# Patient Record
Sex: Female | Born: 1975 | Race: Black or African American | Hispanic: No
Health system: Southern US, Community
[De-identification: ages and names within clinical notes are randomized; demographics above are authoritative.]

---

## 2021-04-14 ENCOUNTER — Emergency Department (HOSPITAL_COMMUNITY)
Admission: EM | Admit: 2021-04-14 | Discharge: 2021-04-14 | Disposition: A | Discharge status: Home / Self Care | Payer: Medicare HMO | Attending: Emergency Medicine | Admitting: Emergency Medicine

## 2021-04-14 ENCOUNTER — Other Ambulatory Visit: Payer: Self-pay

## 2021-04-14 ENCOUNTER — Emergency Department (HOSPITAL_COMMUNITY): Payer: Medicare HMO

## 2021-04-14 DIAGNOSIS — Z91040 Latex allergy status: Secondary | ICD-10-CM | POA: Insufficient documentation

## 2021-04-14 DIAGNOSIS — M25561 Pain in right knee: Secondary | ICD-10-CM | POA: Diagnosis present

## 2021-04-14 DIAGNOSIS — M25562 Pain in left knee: Secondary | ICD-10-CM | POA: Insufficient documentation

## 2021-04-14 DIAGNOSIS — R600 Localized edema: Secondary | ICD-10-CM | POA: Insufficient documentation

## 2021-04-14 DIAGNOSIS — G8929 Other chronic pain: Secondary | ICD-10-CM | POA: Diagnosis not present

## 2021-04-14 LAB — CBC WITH DIFFERENTIAL/PLATELET
Abs Immature Granulocytes: 0.02 10*3/uL (ref 0.00–0.07)
Basophils Absolute: 0 10*3/uL (ref 0.0–0.1)
Basophils Relative: 1 %
Eosinophils Absolute: 0.3 10*3/uL (ref 0.0–0.5)
Eosinophils Relative: 5 %
HCT: 35.5 % — ABNORMAL LOW (ref 36.0–46.0)
Hemoglobin: 11.7 g/dL — ABNORMAL LOW (ref 12.0–15.0)
Immature Granulocytes: 0 %
Lymphocytes Relative: 36 %
Lymphs Abs: 2.4 10*3/uL (ref 0.7–4.0)
MCH: 31.1 pg (ref 26.0–34.0)
MCHC: 33 g/dL (ref 30.0–36.0)
MCV: 94.4 fL (ref 80.0–100.0)
Monocytes Absolute: 0.7 10*3/uL (ref 0.1–1.0)
Monocytes Relative: 10 %
Neutro Abs: 3.3 10*3/uL (ref 1.7–7.7)
Neutrophils Relative %: 48 %
Platelets: 361 10*3/uL (ref 150–400)
RBC: 3.76 MIL/uL — ABNORMAL LOW (ref 3.87–5.11)
RDW: 15.7 % — ABNORMAL HIGH (ref 11.5–15.5)
WBC: 6.8 10*3/uL (ref 4.0–10.5)
nRBC: 0 % (ref 0.0–0.2)

## 2021-04-14 LAB — COMPREHENSIVE METABOLIC PANEL
ALT: 21 U/L (ref 0–44)
AST: 26 U/L (ref 15–41)
Albumin: 3.3 g/dL — ABNORMAL LOW (ref 3.5–5.0)
Alkaline Phosphatase: 76 U/L (ref 38–126)
Anion gap: 4 — ABNORMAL LOW (ref 5–15)
BUN: 5 mg/dL — ABNORMAL LOW (ref 6–20)
CO2: 30 mmol/L (ref 22–32)
Calcium: 9.2 mg/dL (ref 8.9–10.3)
Chloride: 104 mmol/L (ref 98–111)
Creatinine, Ser: 0.77 mg/dL (ref 0.44–1.00)
GFR, Estimated: >60 mL/min (ref >60)
Glucose, Bld: 95 mg/dL (ref 70–99)
Potassium: 3.4 mmol/L — ABNORMAL LOW (ref 3.5–5.1)
Sodium: 138 mmol/L (ref 135–145)
Total Bilirubin: 0.6 mg/dL (ref 0.3–1.2)
Total Protein: 7.4 g/dL (ref 6.5–8.1)

## 2021-04-14 LAB — BRAIN NATRIURETIC PEPTIDE: B Natriuretic Peptide: 29.1 pg/mL (ref 0.0–100.0)

## 2021-04-14 MED ORDER — OXYCODONE-ACETAMINOPHEN 5-325 MG PO TABS
1.0000 | ORAL_TABLET | Freq: Once | ORAL | Status: AC
  Administered 2021-04-14: 1 via ORAL
  Filled 2021-04-14: qty 1

## 2021-04-14 MED ORDER — OXYCODONE-ACETAMINOPHEN 5-325 MG PO TABS: 1.0000 | ORAL_TABLET | Freq: Four times a day (QID) | ORAL | 0 refills | Status: AC | PRN

## 2021-04-14 NOTE — Discharge Instructions (Signed)
Call your primary care doctor or specialist as discussed in the next 2-3 days.   Return immediately back to the ER if:  Your symptoms worsen within the next 12-24 hours. You develop new symptoms such as new fevers, persistent vomiting, new pain, shortness of breath, or new weakness or numbness, or if you have any other concerns.  

## 2021-04-14 NOTE — ED Triage Notes (Signed)
Pt here via pov with reports of bil knee pain and edema for several weeks. Pt reports using voltaren, ibuprofen and flexeril to alleviate the pain without relief. Denies known injury.

## 2021-04-14 NOTE — ED Triage Notes (Signed)
Emergency Medicine Provider Triage Evaluation Note  Deborah Burch , a 45 y.o. female  was evaluated in triage.  Pt complains of bilateral knee pain for the past several weeks. Reports history of torn meniscus in right knee April last year with fixation - no issues until about a couple of weeks ago. Also complaining of bilateral leg swelling; had her spironolactone and lasix increased over the past couple of weeks in [facility] where she resides with some improvement. Was having pain and swelling prior to traveling by plane to [place] to visit her sister. No chest pain or SOB.  Review of Systems  Positive: + bilateral knee pain, + bilateral leg swelling Negative: - chest pain, - SOB  Physical Exam  BP 117/82 (BP Location: Left Arm)   Pulse (!) 105   Temp 98.7 F (37.1 C) (Oral)   Resp 16   SpO2 97%  Gen:   Awake, no distress   HEENT:  Atraumatic  Resp:  Normal effort  Cardiac:  Normal rate  Abd:   Nondistended, nontender  MSK:   Moves extremities without difficulty. 1+ pitting edema to BLEs. + TTP throughout calves and knees.  Neuro:  Speech clear   Medical Decision Making  Medically screening exam initiated at 11:22 AM.  Appropriate orders placed.  Deborah Burch was informed that the remainder of the evaluation will be completed by another provider, this initial triage assessment does not replace that evaluation, and the importance of remaining in the ED until their evaluation is complete.  Clinical Impression  45 year old female with atraumatic bilateral knee pain and leg swelling for the past several weeks; no trauma. On fluids pills however denies hx CHF. Currently visiting sister from [place]. Will plan for xrays and labwork including BNP to assess for concern for fluid overload. Given BLE swelling lower suspicion for DVT.    Deborah Rockers, PA-C 04/14/21 1125

## 2021-04-14 NOTE — ED Provider Notes (Signed)
[facility] EMERGENCY DEPARTMENT Provider Note   CSN: [id number] Arrival date & time: 04/14/21  1058     History No chief complaint on file.   Deborah Burch is a 45 y.o. female.  Patient presents with bilateral knee pain.  Ongoing for several weeks.  She states the right is worse than the left.  Denies any new fall or trauma.  She recently has been visiting the area from [place].  She has an orthopedist specialist in [place] with whom she has been following for her knee pain.  She also has a history of arthritis sees a rheumatologist.  She also has a history of intermittent bilateral lower extremity swelling has been taking spironolactone for some time now.  Otherwise denies fevers or cough no vomiting or diarrhea no headache no chest pain or shortness of breath.        No past medical history on file.  There are no problems to display for this patient.      OB History   No obstetric history on file.     No family history on file.     Home Medications Prior to Admission medications   Medication Sig Start Date End Date Taking? Authorizing Provider  oxyCODONE-acetaminophen (PERCOCET/ROXICET) 5-325 MG tablet Take 1 tablet by mouth every 6 (six) hours as needed for severe pain. 04/14/21  Yes Cheryll Cockayne, MD    Allergies    Latex and Sulfa antibiotics  Review of Systems   Review of Systems  Constitutional: Negative for fever.  HENT: Negative for ear pain.   Eyes: Negative for pain.  Respiratory: Negative for cough.   Cardiovascular: Negative for chest pain.  Gastrointestinal: Negative for abdominal pain.  Genitourinary: Negative for flank pain.  Musculoskeletal: Negative for back pain.  Skin: Negative for rash.  Neurological: Negative for headaches.    Physical Exam Updated Vital Signs BP 121/86   Pulse 98   Temp 98.7 F (37.1 C) (Oral)   Resp 16   SpO2 98%   Physical Exam Constitutional:      General: She is not in acute  distress.    Appearance: Normal appearance.  HENT:     Head: Normocephalic.     Nose: Nose normal.  Eyes:     Extraocular Movements: Extraocular movements intact.  Cardiovascular:     Rate and Rhythm: Normal rate.  Pulmonary:     Effort: Pulmonary effort is normal.  Musculoskeletal:        General: Normal range of motion.     Cervical back: Normal range of motion.     Comments: Bilateral diffuse knee tenderness noted.  On visual inspection no gross abnormality or or swelling noted.  No abnormal warmth noted on palpation.  Patient has normal range of motion but describes it as painful.  Neurovascular intact otherwise.  Bilateral lower extremity has 1+ pitting edema bilaterally.  No abnormal warmth or cellulitis noted.    Neurological:     General: No focal deficit present.     Mental Status: She is alert. Mental status is at baseline.     ED Results / Procedures / Treatments   Labs (all labs ordered are listed, but only abnormal results are displayed) Labs Reviewed  COMPREHENSIVE METABOLIC PANEL - Abnormal; Notable for the following components:      Result Value   Potassium 3.4 (*)    BUN 5 (*)    Albumin 3.3 (*)    Anion gap 4 (*)  All other components within normal limits  CBC WITH DIFFERENTIAL/PLATELET - Abnormal; Notable for the following components:   RBC 3.76 (*)    Hemoglobin 11.7 (*)    HCT 35.5 (*)    RDW 15.7 (*)    All other components within normal limits  BRAIN NATRIURETIC PEPTIDE    EKG None  Radiology DG Chest 2 View  Result Date: 04/14/2021 CLINICAL DATA:  Bilateral knee pain and edema for several weeks. EXAM: CHEST - 2 VIEW COMPARISON:  None. FINDINGS: The heart size and mediastinal contours are within normal limits. Both lungs are clear. No pleural effusion or pneumothorax. The visualized skeletal structures are intact. IMPRESSION: No active cardiopulmonary disease. Electronically Signed   By: Amie Portland M.D.   On: 04/14/2021 12:21   DG  Knee Complete 4 Views Left  Result Date: 04/14/2021 CLINICAL DATA:  Bilateral knee pain and edema for several weeks. No known injury. EXAM: LEFT KNEE - COMPLETE 4+ VIEW COMPARISON:  None. FINDINGS: No evidence of fracture, dislocation, or joint effusion. No evidence of arthropathy or other focal bone abnormality. Soft tissues are unremarkable. IMPRESSION: Negative. Electronically Signed   By: Amie Portland M.D.   On: 04/14/2021 12:20   DG Knee Complete 4 Views Right  Result Date: 04/14/2021 CLINICAL DATA:  Bilateral knee pain and edema for several weeks. No known injury. EXAM: RIGHT KNEE - COMPLETE 4+ VIEW COMPARISON:  None. FINDINGS: No evidence of fracture, dislocation, or joint effusion. No evidence of arthropathy or other focal bone abnormality. Soft tissues are unremarkable. IMPRESSION: Negative. Electronically Signed   By: Amie Portland M.D.   On: 04/14/2021 12:20    Procedures Procedures   Medications Ordered in ED Medications  oxyCODONE-acetaminophen (PERCOCET/ROXICET) 5-325 MG per tablet 1 tablet (1 tablet Oral Given 04/14/21 1321)    ED Course  I have reviewed the triage vital signs and the nursing notes.  Pertinent labs & imaging results that were available during my care of the patient were reviewed by me and considered in my medical decision making (see chart for details).    MDM Rules/Calculators/A&P                          Labs are sent white count of 6 hemoglobin is 11 chemistry unremarkable chest x-ray shows no acute cardiopulmonary disease noted.  Given history and evaluation, I doubt DVT, I doubt cellulitis, I doubt septic joint.    Plan remainder of labs and work-up is otherwise unremarkable imaging is unremarkable.  Patient given prescription of pain medication to take at home, advised follow-up with specialist within the week.  Advised return if she has fevers worsening pain or any additional concerns.  Final Clinical Impression(s) / ED Diagnoses Final  diagnoses:  Chronic pain of both knees    Rx / DC Orders ED Discharge Orders         Ordered    oxyCODONE-acetaminophen (PERCOCET/ROXICET) 5-325 MG tablet  Every 6 hours PRN        04/14/21 1321           Cheryll Cockayne, MD 04/14/21 1321

## 2022-03-26 IMAGING — DX DG CHEST 2V
2 series · 2 of 2 positions shown · non-contrast
Comparison: None.

CLINICAL DATA: Bilateral knee pain and edema for several weeks.

EXAM:
CHEST - 2 VIEW

[chest pa]
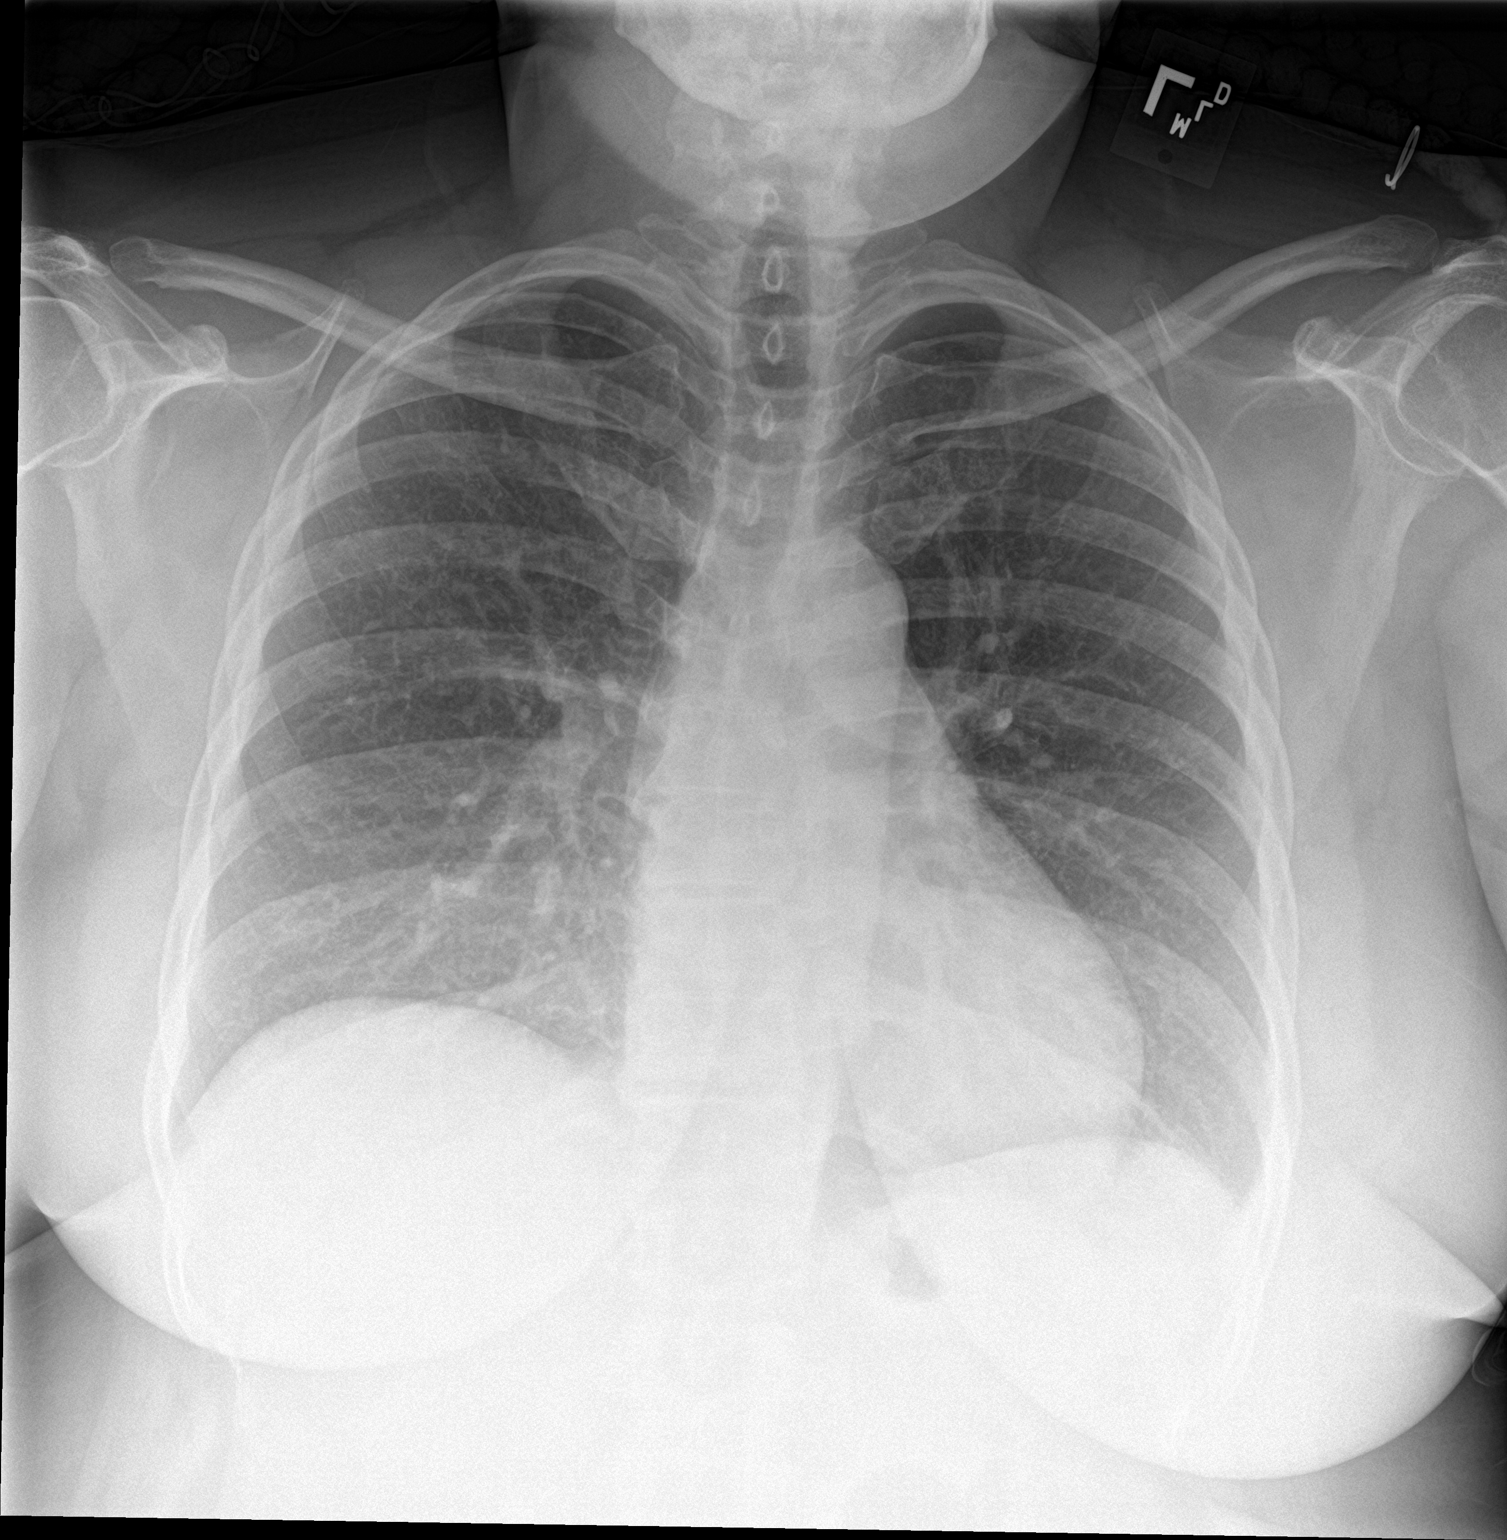

[chest lat]
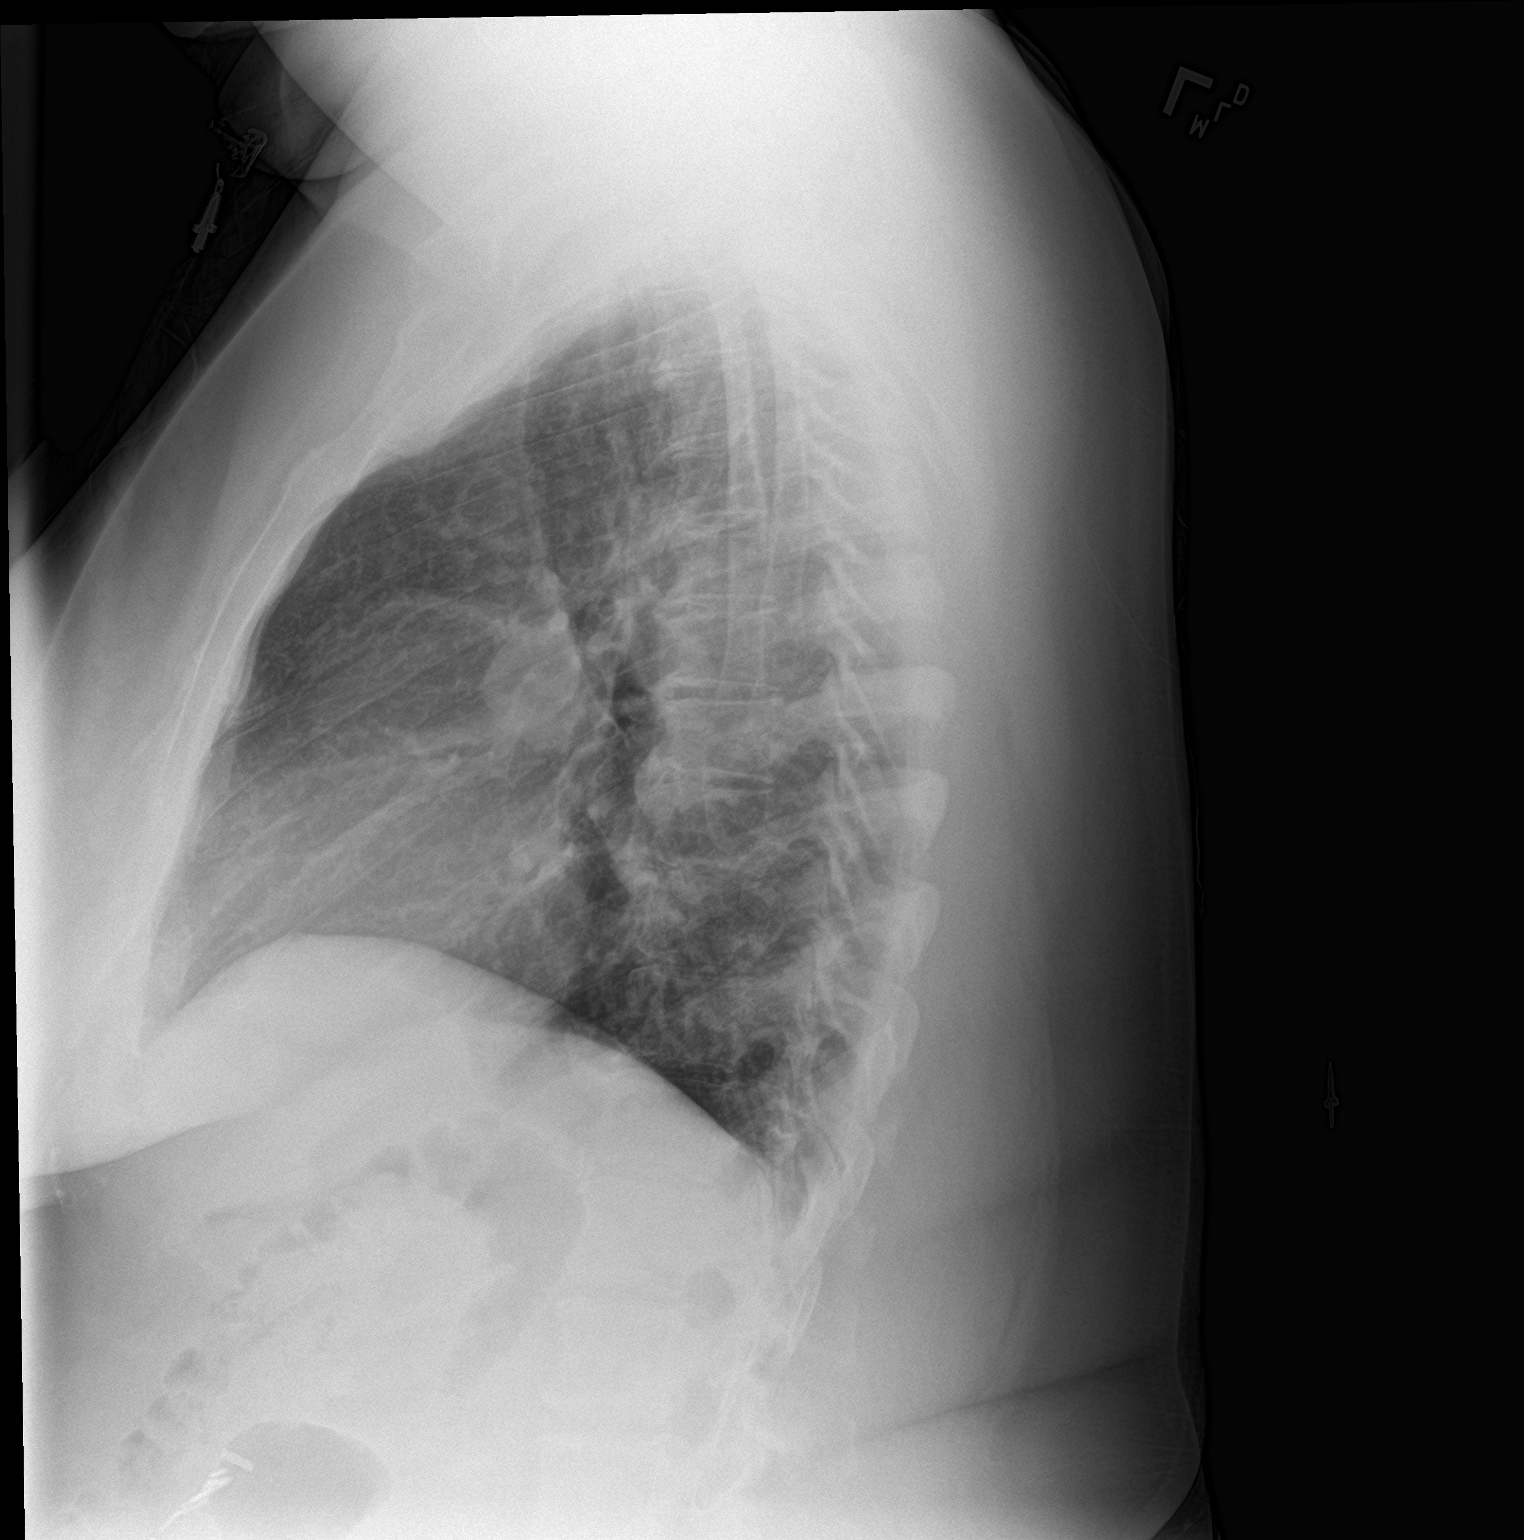

[2 of 2 positions shown; findings below may reference images not displayed]

FINDINGS: The heart size and mediastinal contours are within normal limits.
Both lungs are clear. No pleural effusion or pneumothorax. The
visualized skeletal structures are intact.
IMPRESSION: No active cardiopulmonary disease.

## 2022-03-26 IMAGING — DX DG KNEE COMPLETE 4+V*R*
4 series · 4 of 4 positions shown · non-contrast
Comparison: None.

CLINICAL DATA: Bilateral knee pain and edema for several weeks. No
known injury.

EXAM:
RIGHT KNEE - COMPLETE 4+ VIEW

[knee ap]
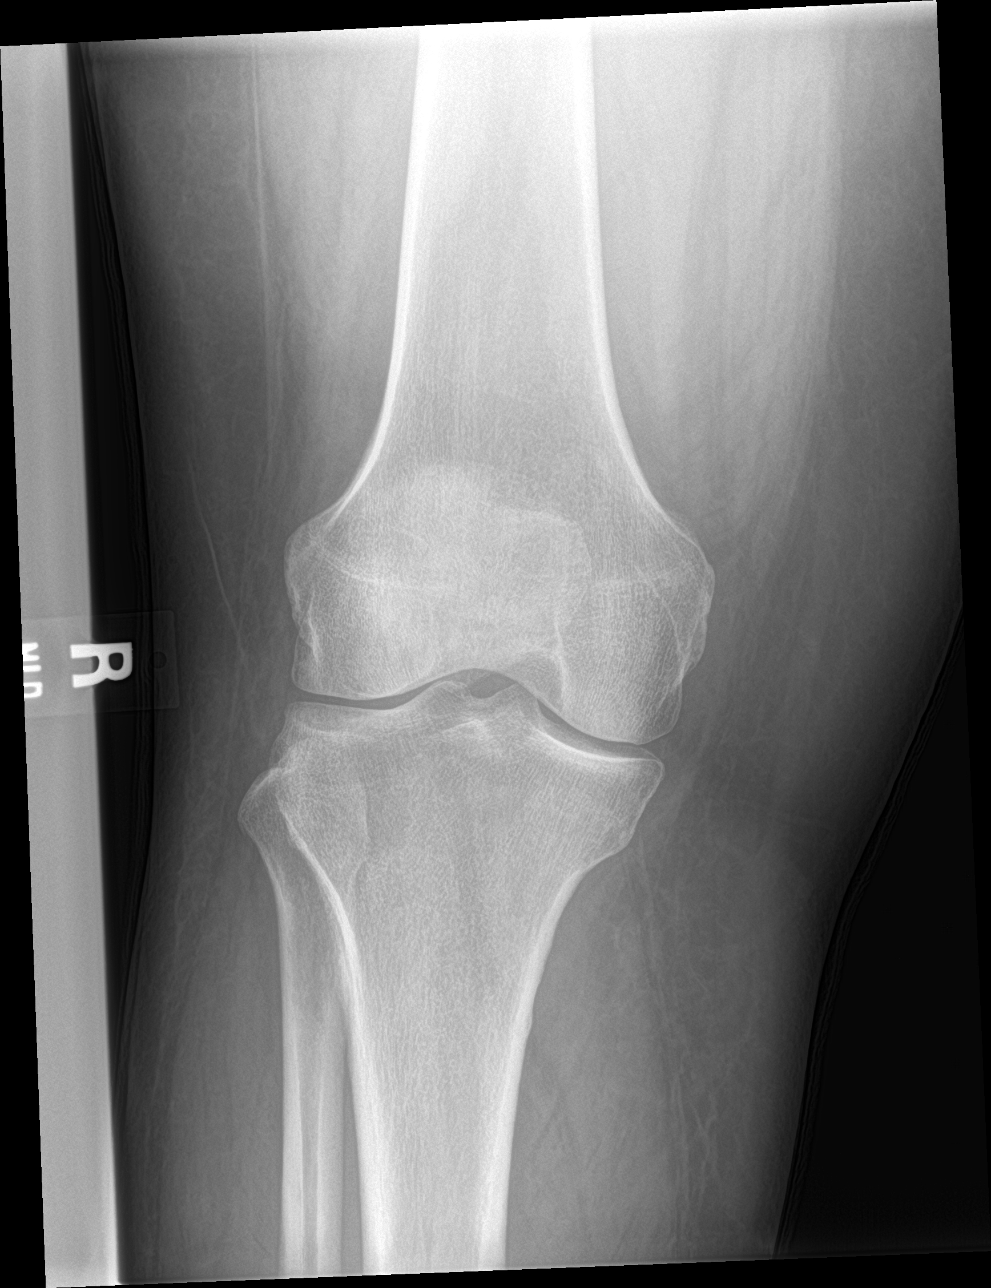

[knee lat]
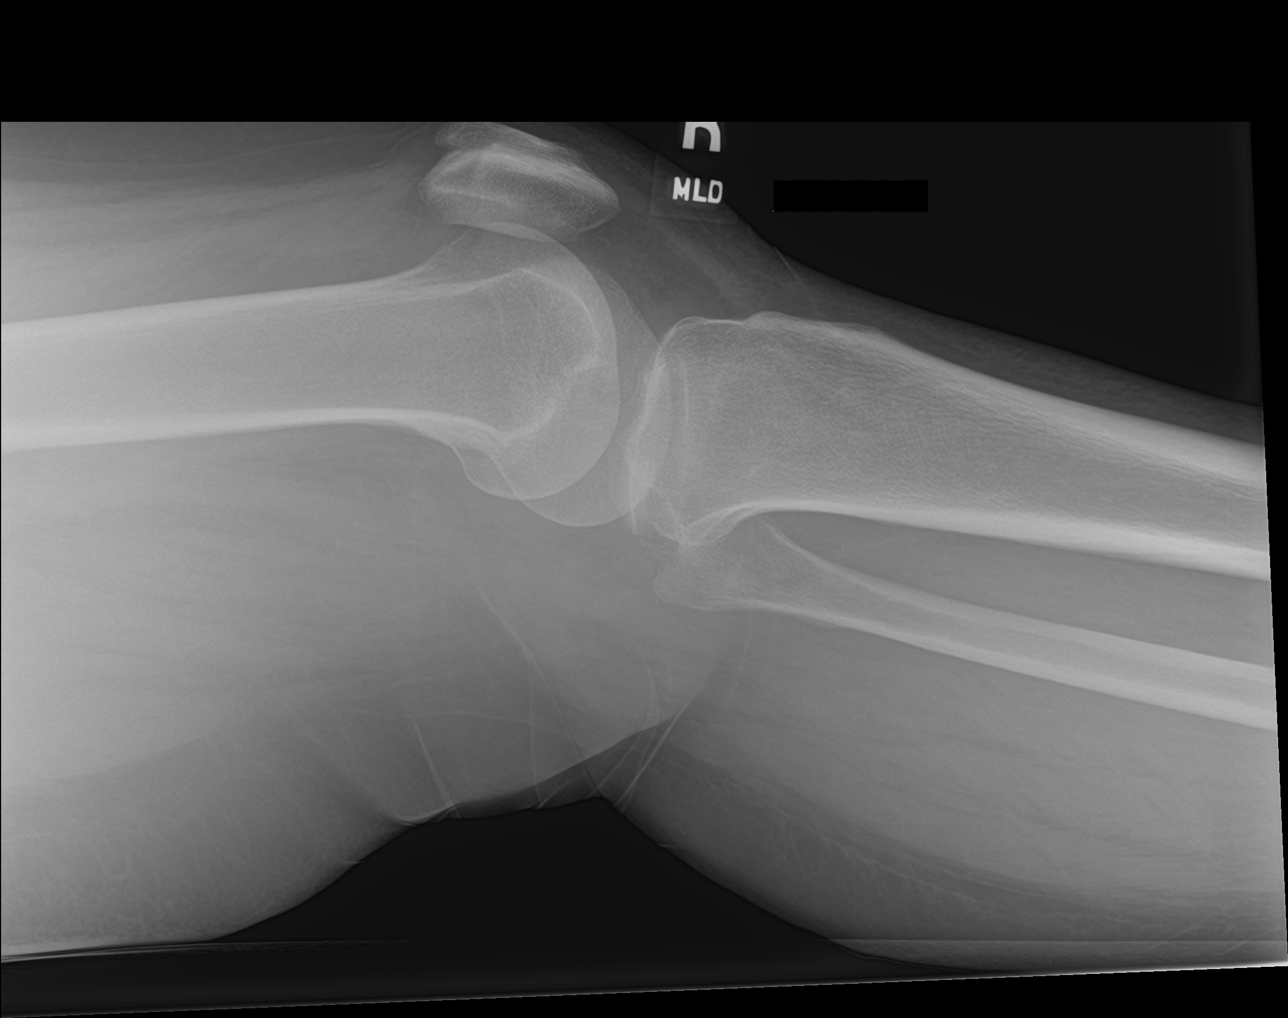

[knee obl (1 of 2)]
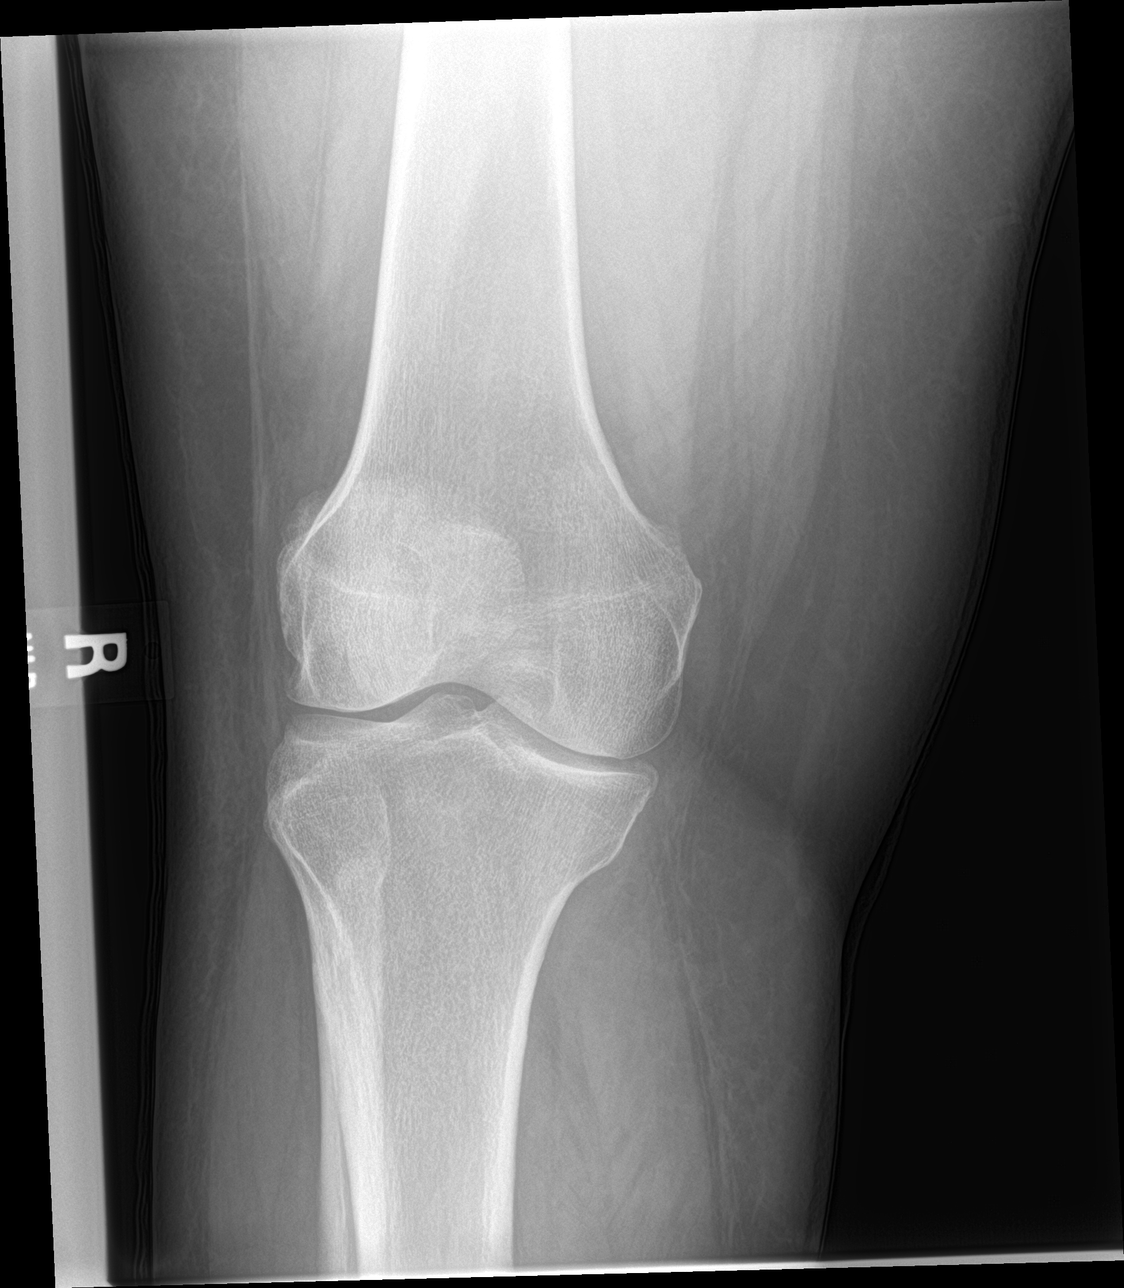

[knee obl (2 of 2)]
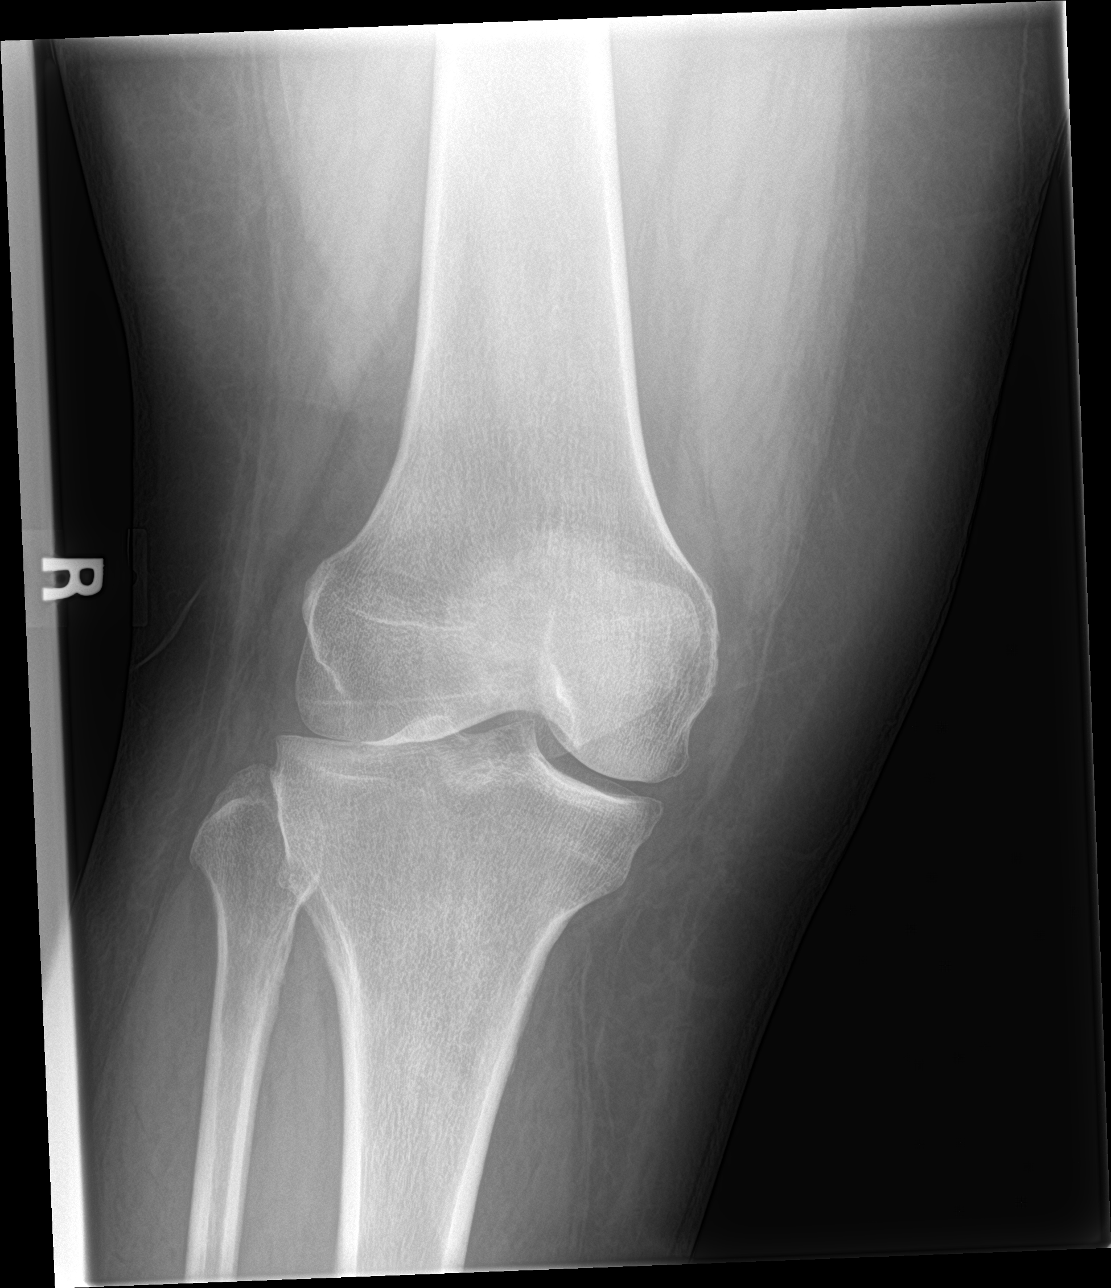

[4 of 4 positions shown; findings below may reference images not displayed]

FINDINGS: No evidence of fracture, dislocation, or joint effusion. No evidence
of arthropathy or other focal bone abnormality. Soft tissues are
unremarkable.
IMPRESSION: Negative.

## 2022-03-26 IMAGING — DX DG KNEE COMPLETE 4+V*L*
4 series · 4 of 4 positions shown · non-contrast
Comparison: None.

CLINICAL DATA: Bilateral knee pain and edema for several weeks. No
known injury.

EXAM:
LEFT KNEE - COMPLETE 4+ VIEW

[knee ap]
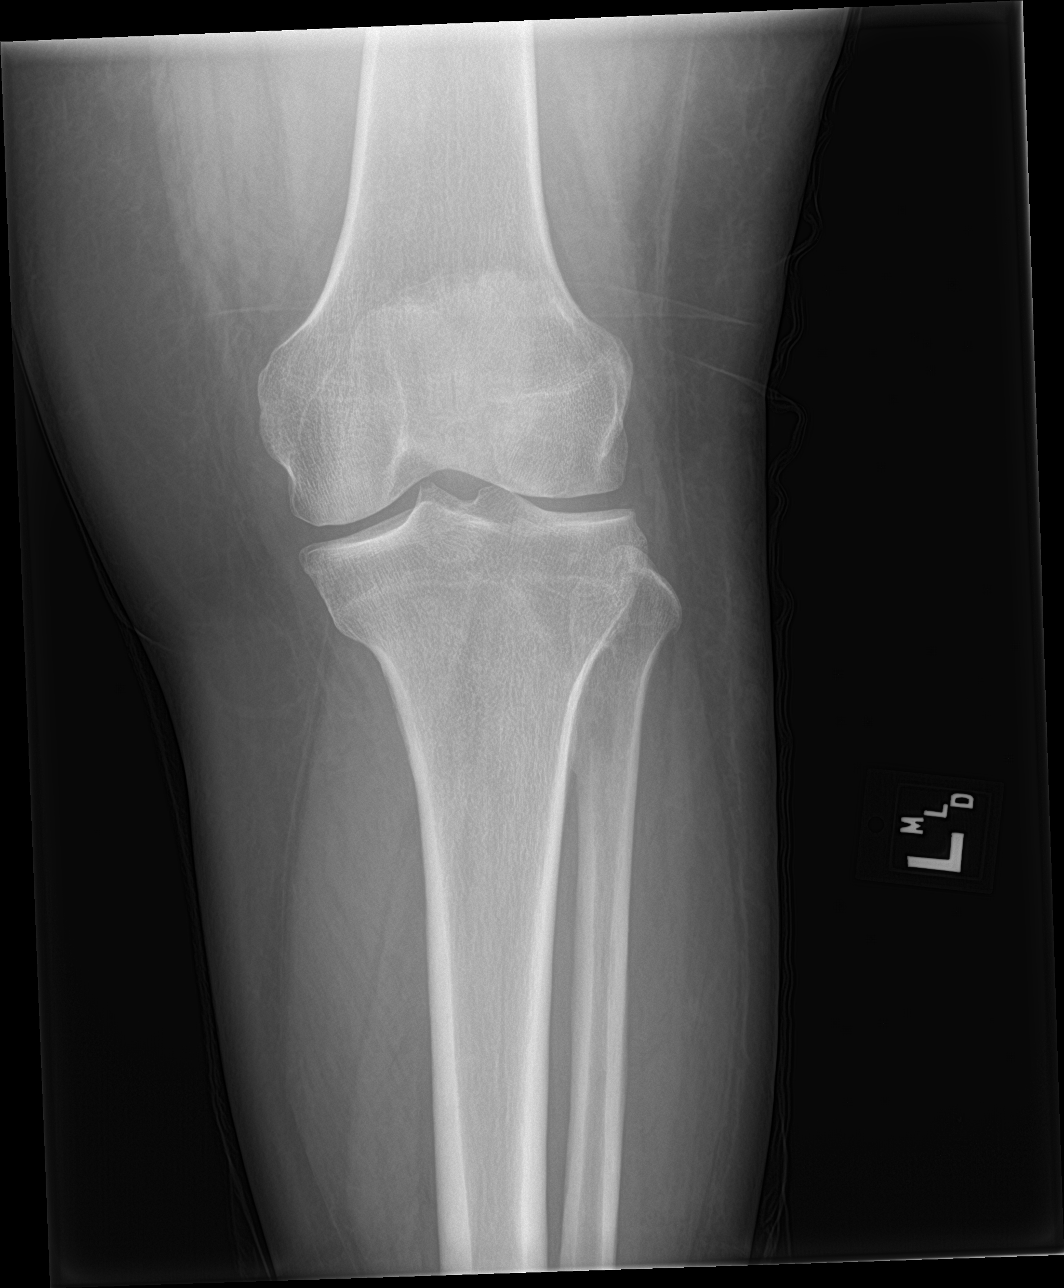

[knee lat]
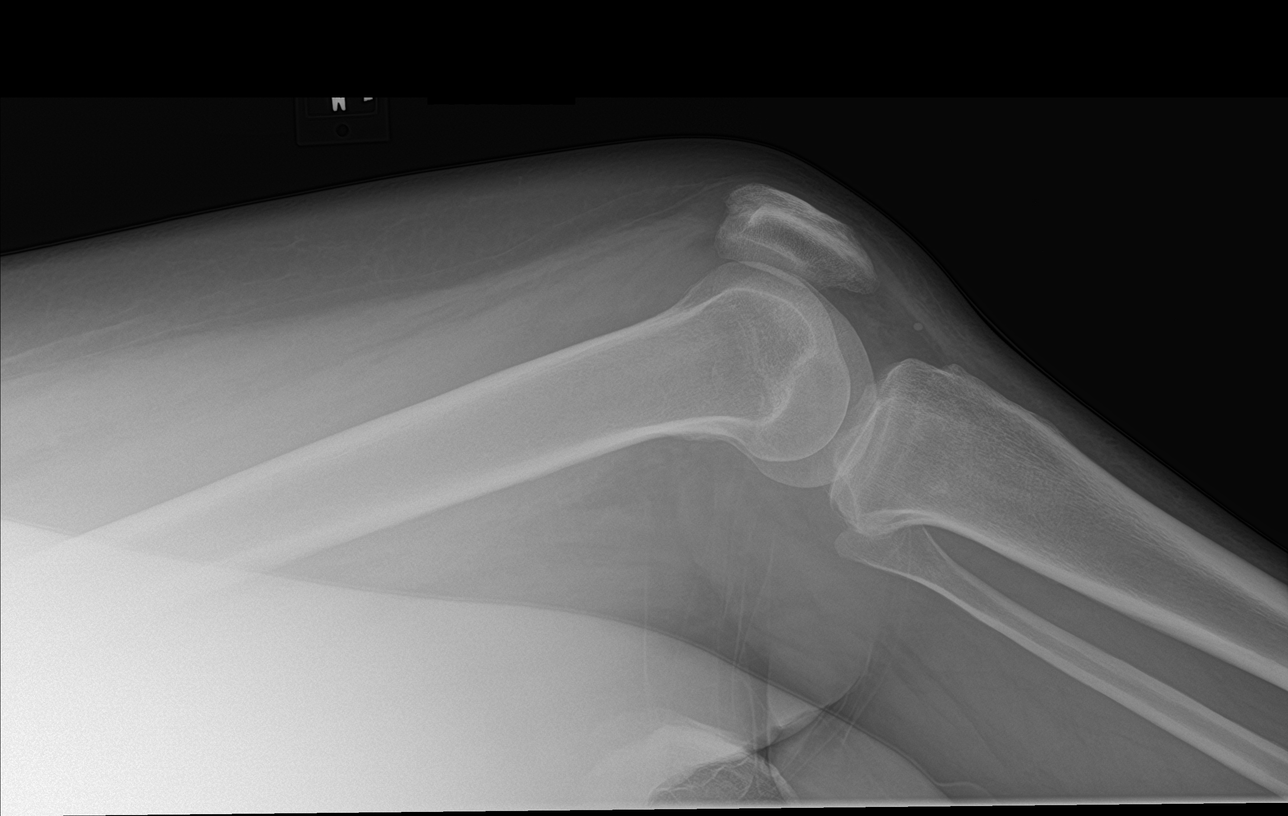

[knee obl (1 of 2)]
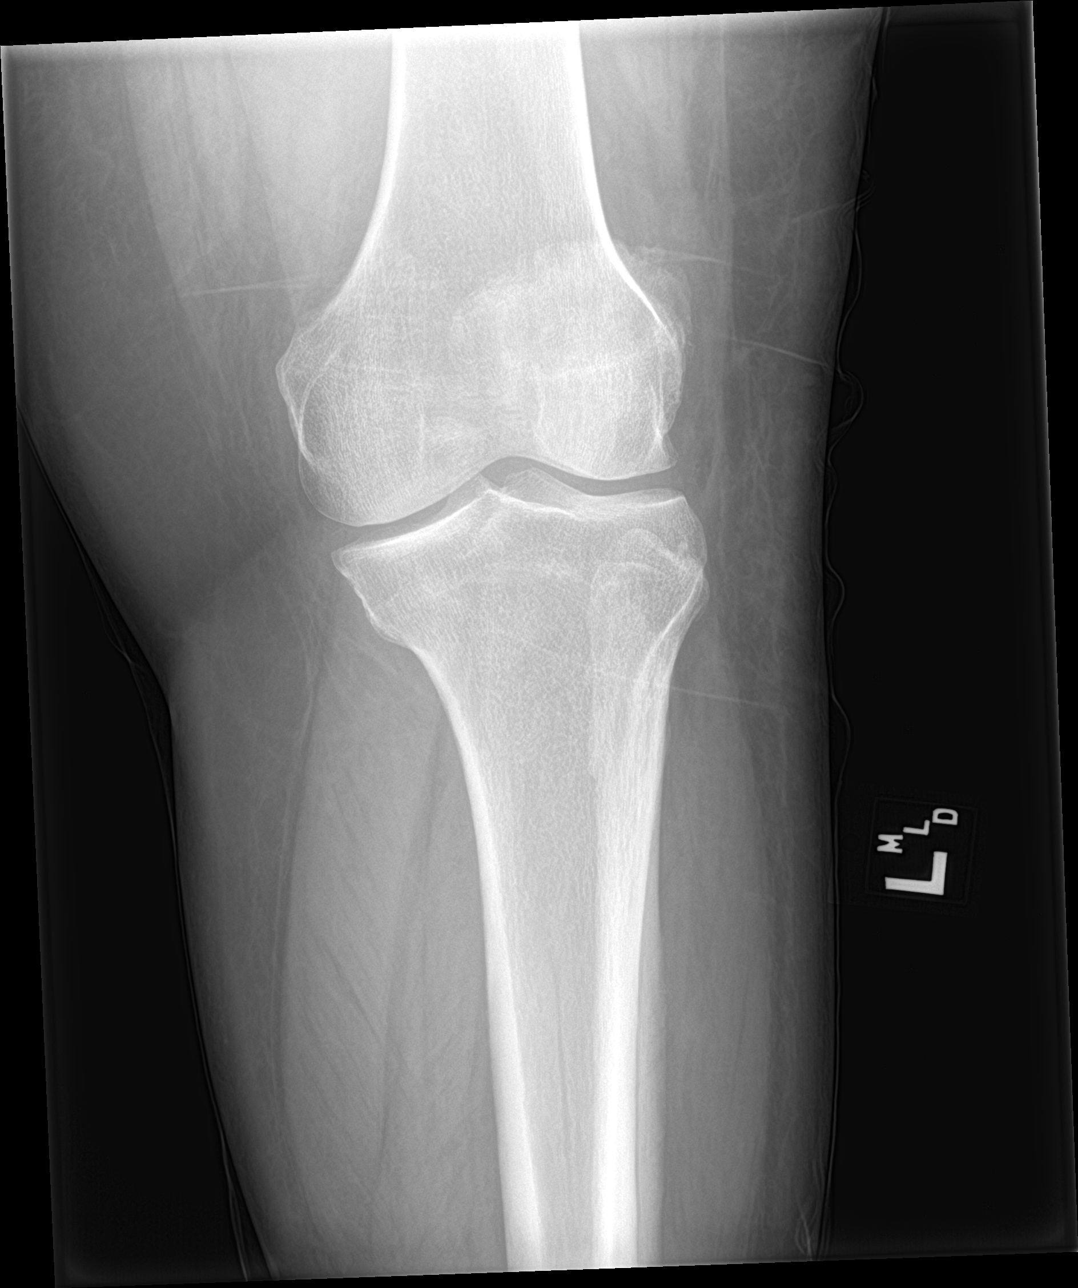

[knee obl (2 of 2)]
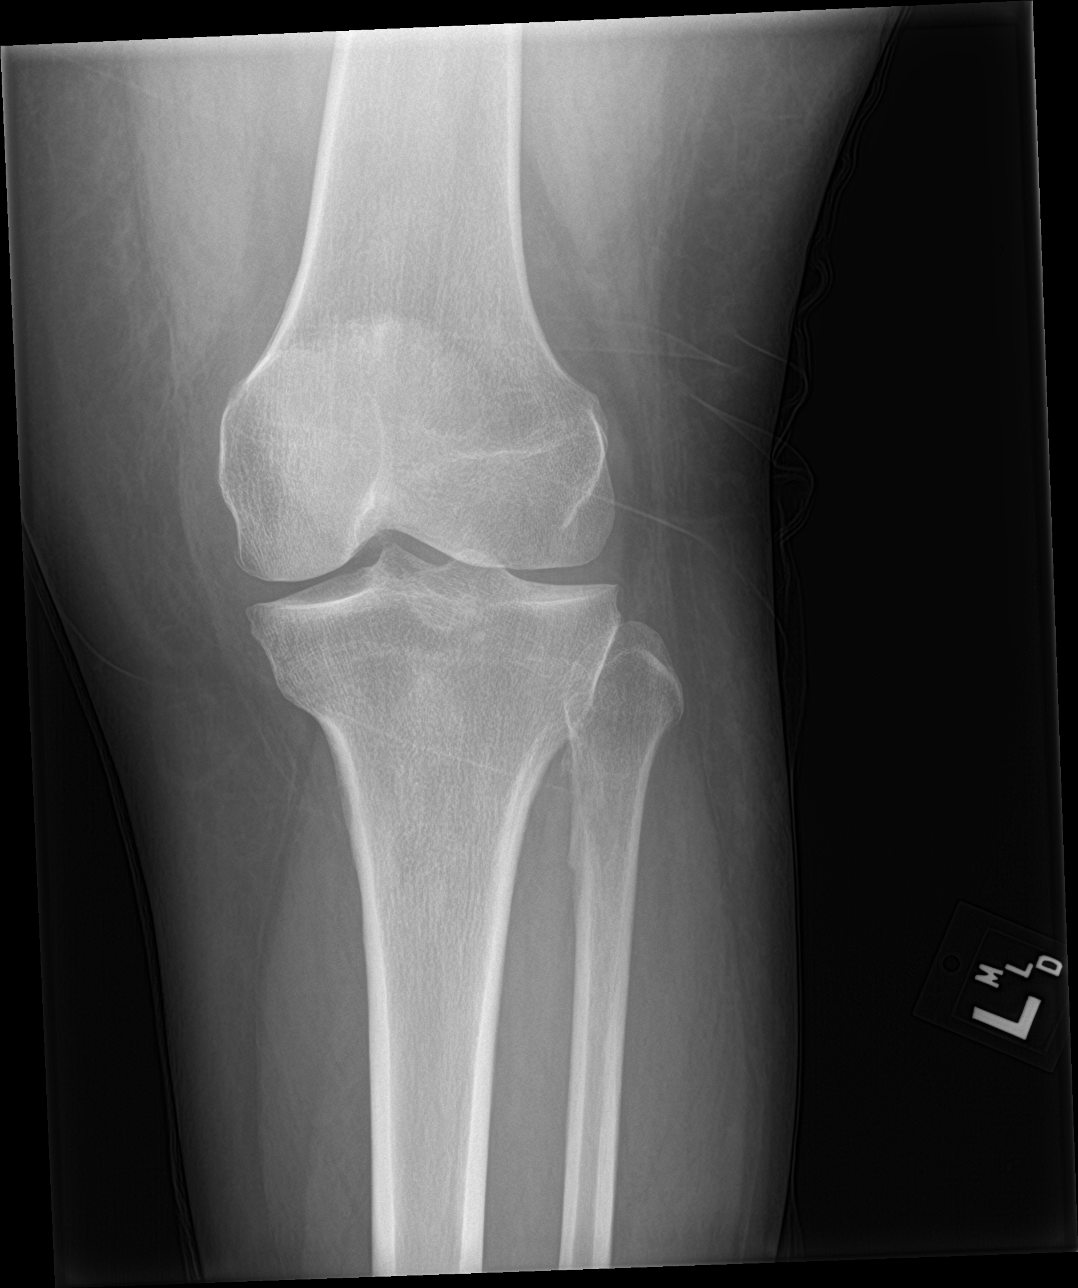

[4 of 4 positions shown; findings below may reference images not displayed]

FINDINGS: No evidence of fracture, dislocation, or joint effusion. No evidence
of arthropathy or other focal bone abnormality. Soft tissues are
unremarkable.
IMPRESSION: Negative.
